# Patient Record
Sex: Female | Born: 1985 | Race: White | Hispanic: No | Marital: Married | Smoking: Never smoker
Health system: Southern US, Community
[De-identification: ages and names within clinical notes are randomized; demographics above are authoritative.]

---

## 2010-03-17 ENCOUNTER — Emergency Department (HOSPITAL_COMMUNITY)
Admission: EM | Admit: 2010-03-17 | Discharge: 2010-03-18 | Disposition: A | Discharge status: Home / Self Care | Attending: Emergency Medicine | Admitting: Emergency Medicine

## 2010-03-17 DIAGNOSIS — R197 Diarrhea, unspecified: Secondary | ICD-10-CM | POA: Insufficient documentation

## 2010-03-17 DIAGNOSIS — N390 Urinary tract infection, site not specified: Secondary | ICD-10-CM | POA: Insufficient documentation

## 2010-03-17 DIAGNOSIS — E860 Dehydration: Secondary | ICD-10-CM | POA: Insufficient documentation

## 2010-03-17 DIAGNOSIS — R112 Nausea with vomiting, unspecified: Secondary | ICD-10-CM | POA: Insufficient documentation

## 2010-03-17 LAB — URINALYSIS, ROUTINE W REFLEX MICROSCOPIC
Nitrite: NEGATIVE
Protein, ur: 30 mg/dL — AB
Urobilinogen, UA: 1 mg/dL (ref 0.0–1.0)
pH: 7 (ref 5.0–8.0)

## 2010-03-17 LAB — CBC
MCH: 31.8 pg (ref 26.0–34.0)
Platelets: 306 10*3/uL (ref 150–400)
RBC: 4.72 MIL/uL (ref 3.87–5.11)
RDW: 12.2 % (ref 11.5–15.5)
WBC: 6.7 10*3/uL (ref 4.0–10.5)

## 2010-03-17 LAB — BASIC METABOLIC PANEL
BUN: 13 mg/dL (ref 6–23)
Chloride: 103 mEq/L (ref 96–112)
Creatinine, Ser: 0.85 mg/dL (ref 0.4–1.2)
GFR calc Af Amer: >60 mL/min (ref >60)
GFR calc non Af Amer: >60 mL/min (ref >60)

## 2010-03-17 LAB — URINE MICROSCOPIC-ADD ON

## 2010-03-17 LAB — PREGNANCY, URINE: Preg Test, Ur: NEGATIVE

## 2010-03-17 LAB — DIFFERENTIAL
Basophils Relative: 0 % (ref 0–1)
Eosinophils Absolute: 0 10*3/uL (ref 0.0–0.7)
Eosinophils Relative: 0 % (ref 0–5)
Neutrophils Relative %: 70 % (ref 43–77)

## 2013-02-06 ENCOUNTER — Emergency Department (HOSPITAL_COMMUNITY): Payer: BC Managed Care – PPO

## 2013-02-06 ENCOUNTER — Emergency Department (HOSPITAL_COMMUNITY)
Admission: EM | Admit: 2013-02-06 | Discharge: 2013-02-07 | Disposition: A | Discharge status: Home / Self Care | Payer: BC Managed Care – PPO | Attending: Emergency Medicine | Admitting: Emergency Medicine

## 2013-02-06 ENCOUNTER — Encounter (HOSPITAL_COMMUNITY): Payer: Self-pay | Admitting: Emergency Medicine

## 2013-02-06 DIAGNOSIS — R5381 Other malaise: Secondary | ICD-10-CM | POA: Insufficient documentation

## 2013-02-06 DIAGNOSIS — R059 Cough, unspecified: Secondary | ICD-10-CM | POA: Insufficient documentation

## 2013-02-06 DIAGNOSIS — R509 Fever, unspecified: Secondary | ICD-10-CM | POA: Insufficient documentation

## 2013-02-06 DIAGNOSIS — J3489 Other specified disorders of nose and nasal sinuses: Secondary | ICD-10-CM | POA: Insufficient documentation

## 2013-02-06 DIAGNOSIS — R1110 Vomiting, unspecified: Secondary | ICD-10-CM | POA: Insufficient documentation

## 2013-02-06 DIAGNOSIS — R6889 Other general symptoms and signs: Secondary | ICD-10-CM

## 2013-02-06 DIAGNOSIS — J029 Acute pharyngitis, unspecified: Secondary | ICD-10-CM | POA: Insufficient documentation

## 2013-02-06 DIAGNOSIS — R05 Cough: Secondary | ICD-10-CM | POA: Insufficient documentation

## 2013-02-06 DIAGNOSIS — R000 Tachycardia, unspecified: Secondary | ICD-10-CM | POA: Insufficient documentation

## 2013-02-06 DIAGNOSIS — R5383 Other fatigue: Secondary | ICD-10-CM

## 2013-02-06 DIAGNOSIS — R51 Headache: Secondary | ICD-10-CM | POA: Insufficient documentation

## 2013-02-06 DIAGNOSIS — Z3202 Encounter for pregnancy test, result negative: Secondary | ICD-10-CM | POA: Insufficient documentation

## 2013-02-06 DIAGNOSIS — Z79899 Other long term (current) drug therapy: Secondary | ICD-10-CM | POA: Insufficient documentation

## 2013-02-06 MED ORDER — SODIUM CHLORIDE 0.9 % IV BOLUS (SEPSIS)
1000.0000 mL | Freq: Once | INTRAVENOUS | Status: AC
  Administered 2013-02-06: 1000 mL via INTRAVENOUS

## 2013-02-06 MED ORDER — IBUPROFEN 800 MG PO TABS
800.0000 mg | ORAL_TABLET | Freq: Once | ORAL | Status: AC
  Administered 2013-02-07: 800 mg via ORAL
  Filled 2013-02-06: qty 1

## 2013-02-06 NOTE — ED Notes (Signed)
Patient has been intermittently sick for @ 2 weeks. Nausea and vomiting, now has a cough that started productive but is now dry and painful.

## 2013-02-06 NOTE — ED Notes (Signed)
Pt states two weekends ago she thought she had a 24 hr virus then last weekend she states she was vomiting again then Saturday she started getting a cough and had sinus drainage  Pt states her cough got worse yesterday and today she started running a fever and having nausea and coughing up foamy white sputum

## 2013-02-07 LAB — URINALYSIS W MICROSCOPIC + REFLEX CULTURE
Bilirubin Urine: NEGATIVE
GLUCOSE, UA: NEGATIVE mg/dL
KETONES UR: NEGATIVE mg/dL
LEUKOCYTES UA: NEGATIVE
NITRITE: NEGATIVE
PROTEIN: NEGATIVE mg/dL
Specific Gravity, Urine: 1.005 (ref 1.005–1.030)
Urobilinogen, UA: 0.2 mg/dL (ref 0.0–1.0)
pH: 6 (ref 5.0–8.0)

## 2013-02-07 LAB — CBC WITH DIFFERENTIAL/PLATELET
BASOS ABS: 0 10*3/uL (ref 0.0–0.1)
Basophils Relative: 0 % (ref 0–1)
EOS ABS: 0 10*3/uL (ref 0.0–0.7)
Eosinophils Relative: 0 % (ref 0–5)
HCT: 37.8 % (ref 36.0–46.0)
Hemoglobin: 13.5 g/dL (ref 12.0–15.0)
LYMPHS PCT: 9 % — AB (ref 12–46)
Lymphs Abs: 1.3 10*3/uL (ref 0.7–4.0)
MCH: 31.3 pg (ref 26.0–34.0)
MCHC: 35.7 g/dL (ref 30.0–36.0)
MCV: 87.7 fL (ref 78.0–100.0)
Monocytes Absolute: 0.6 10*3/uL (ref 0.1–1.0)
Monocytes Relative: 4 % (ref 3–12)
NEUTROS PCT: 87 % — AB (ref 43–77)
Neutro Abs: 12.8 10*3/uL — ABNORMAL HIGH (ref 1.7–7.7)
Platelets: 313 10*3/uL (ref 150–400)
RBC: 4.31 MIL/uL (ref 3.87–5.11)
RDW: 12.4 % (ref 11.5–15.5)
WBC: 14.7 10*3/uL — ABNORMAL HIGH (ref 4.0–10.5)

## 2013-02-07 LAB — POCT I-STAT, CHEM 8
BUN: 7 mg/dL (ref 6–23)
CALCIUM ION: 1.2 mmol/L (ref 1.12–1.23)
CHLORIDE: 102 meq/L (ref 96–112)
Creatinine, Ser: 1 mg/dL (ref 0.50–1.10)
Glucose, Bld: 136 mg/dL — ABNORMAL HIGH (ref 70–99)
HCT: 40 % (ref 36.0–46.0)
Hemoglobin: 13.6 g/dL (ref 12.0–15.0)
Potassium: 4.2 mEq/L (ref 3.7–5.3)
Sodium: 139 mEq/L (ref 137–147)
TCO2: 23 mmol/L (ref 0–100)

## 2013-02-07 LAB — POCT PREGNANCY, URINE: PREG TEST UR: NEGATIVE

## 2013-02-07 MED ORDER — KETOROLAC TROMETHAMINE 30 MG/ML IJ SOLN
30.0000 mg | Freq: Once | INTRAMUSCULAR | Status: AC
  Administered 2013-02-07: 30 mg via INTRAVENOUS

## 2013-02-07 MED ORDER — KETOROLAC TROMETHAMINE 30 MG/ML IJ SOLN
INTRAMUSCULAR | Status: AC
  Filled 2013-02-07: qty 1

## 2013-02-07 MED ORDER — SODIUM CHLORIDE 0.9 % IV BOLUS (SEPSIS)
1000.0000 mL | Freq: Once | INTRAVENOUS | Status: AC
  Administered 2013-02-07: 1000 mL via INTRAVENOUS

## 2013-02-07 MED ORDER — PSEUDOEPHEDRINE HCL 30 MG PO TABS: 30.0000 mg | ORAL_TABLET | ORAL | Status: DC | PRN

## 2013-02-07 MED ORDER — BENZONATATE 100 MG PO CAPS: 100.0000 mg | ORAL_CAPSULE | Freq: Three times a day (TID) | ORAL | Status: DC

## 2013-02-07 NOTE — Discharge Instructions (Signed)
Your lab work and chest x-ray did not show any significant findings. I think you may have the flu. Rest. Drink plenty of fluids. Tylenol and motrin for fever. Tessalon for cough. Sudafed for congestion. Follow up with primary care doctor.   Influenza A (H1N1) H1N1 formerly called "swine flu" is a new influenza virus causing sickness in people. The H1N1 virus is different from seasonal influenza viruses. However, the H1N1 symptoms are similar to seasonal influenza and it is spread from person to person. You may be at higher risk for serious problems if you have underlying serious medical conditions. The CDC and the [employer] are following reported cases around the world. CAUSES   The flu is thought to spread mainly person-to-person through coughing or sneezing of infected people.  A person may become infected by touching something with the virus on it and then touching their mouth or nose. SYMPTOMS   Fever.  Headache.  Tiredness.  Cough.  Sore throat.  Runny or stuffy nose.  Body aches.  Diarrhea and vomiting These symptoms are referred to as "flu-like symptoms." A lot of different illnesses, including the common cold, may have similar symptoms. DIAGNOSIS   There are tests that can tell if you have the H1N1 virus.  Confirmed cases of H1N1 will be reported to the state or local health department.  A doctor's exam may be needed to tell whether you have an infection that is a complication of the flu. HOME CARE INSTRUCTIONS   Stay informed. Visit the [facility] website for current recommendations. Visit [employer]tnwww.cdc.gov/H1N1flu/. You may also call 1-800-CDC-INFO (669-597-33291-(651)535-8827).  Get help early if you develop any of the above symptoms.  If you are at high risk from complications of the flu, talk to your caregiver as soon as you develop flu-like symptoms. Those at higher risk for complications include:  People 65 years or older.  People with chronic medical  conditions.  Pregnant women.  Young children.  Your caregiver may recommend antiviral medicine to help treat the flu.  If you get the flu, get plenty of rest, drink enough water and fluids to keep your urine clear or pale yellow, and avoid using alcohol or tobacco.  You may take over-the-counter medicine to relieve the symptoms of the flu if your caregiver approves. (Never give aspirin to children or teenagers who have flu-like symptoms, particularly fever). TREATMENT  If you do get sick, antiviral drugs are available. These drugs can make your illness milder and make you feel better faster. Treatment should start soon after illness starts. It is only effective if taken within the first day of becoming ill. Only your caregiver can prescribe antiviral medication.  PREVENTION   Cover your nose and mouth with a tissue or your arm when you cough or sneeze. Throw the tissue away.  Wash your hands often with soap and warm water, especially after you cough or sneeze. Alcohol-based cleaners are also effective against germs.  Avoid touching your eyes, nose or mouth. This is one way germs spread.  Try to avoid contact with sick people. Follow public health advice regarding school closures. Avoid crowds.  Stay home if you get sick. Limit contact with others to keep from infecting them. People infected with the H1N1 virus may be able to infect others anywhere from 1 day before feeling sick to 5-7 days after getting flu symptoms.  An H1N1 vaccine is available to help protect against the virus. In addition to the H1N1 vaccine, you will need to be  vaccinated for seasonal influenza. The H1N1 and seasonal vaccines may be given on the same day. The CDC especially recommends the H1N1 vaccine for:  Pregnant women.  People who live with or care for children younger than 7 months of age.  Health care and emergency services personnel.  Persons between the ages of 72 months through 92 years of age.  People  from ages 66 through 63 years who are at higher risk for H1N1 because of chronic health disorders or immune system problems. FACEMASKS In community and home settings, the use of facemasks and N95 respirators are not normally recommended. In certain circumstances, a facemask or N95 respirator may be used for persons at increased risk of severe illness from influenza. Your caregiver can give additional recommendations for facemask use. IN CHILDREN, EMERGENCY WARNING SIGNS THAT NEED URGENT MEDICAL CARE:  Fast breathing or trouble breathing.  Bluish skin color.  Not drinking enough fluids.  Not waking up or not interacting normally.  Being so fussy that the child does not want to be held.  Your child has an oral temperature above 102 F (38.9 C), not controlled by medicine.  Your baby is older than 3 months with a rectal temperature of 102 F (38.9 C) or higher.  Your baby is 54 months old or younger with a rectal temperature of 100.4 F (38 C) or higher.  Flu-like symptoms improve but then return with fever and worse cough. IN ADULTS, EMERGENCY WARNING SIGNS THAT NEED URGENT MEDICAL CARE:  Difficulty breathing or shortness of breath.  Pain or pressure in the chest or abdomen.  Sudden dizziness.  Confusion.  Severe or persistent vomiting.  Bluish color.  You have a oral temperature above 102 F (38.9 C), not controlled by medicine.  Flu-like symptoms improve but return with fever and worse cough. SEEK IMMEDIATE MEDICAL CARE IF:  You or someone you know is experiencing any of the above symptoms. When you arrive at the emergency center, report that you think you have the flu. You may be asked to wear a mask and/or sit in a secluded area to protect others from getting sick. MAKE SURE YOU:   Understand these instructions.  Will watch your condition.  Will get help right away if you are not doing well or get worse. Some of this information courtesy of the CDC.  Document  Released: 07/08/2007 Document Revised: 04/13/2011 Document Reviewed: 07/08/2007 [facility] Patient Information 2014 [place], [place].

## 2013-02-07 NOTE — ED Provider Notes (Signed)
Medical screening examination/treatment/procedure(s) were performed by non-physician practitioner and as supervising physician I was immediately available for consultation/collaboration.  EKG Interpretation   None        Quenisha Lovins K Azalynn Maxim-Rasch, MD 02/07/13 0344 

## 2013-02-07 NOTE — ED Provider Notes (Signed)
Medical screening examination/treatment/procedure(s) were performed by non-physician practitioner and as supervising physician I was immediately available for consultation/collaboration.  EKG Interpretation   None        Clinton Wahlberg K Alailah Safley-Rasch, MD 02/07/13 [phone]

## 2013-02-07 NOTE — ED Notes (Signed)
Patient advised we need a urine sample. Requested to call nurse when she is able to provide sample.

## 2013-02-07 NOTE — ED Provider Notes (Signed)
[facility] care [facility] PA-C at shift change with UA pending. Patient already made aware of plan for d/c should UA be unremarkable.  Urinalysis today does not suggest infection. Patient has remained hemodynamically stable during ED course. Tachycardia has improved since arrival with IVF hydration. Return precautions provided. Patient stable for discharge.   Results for orders placed during the hospital encounter of 02/06/13  CBC WITH DIFFERENTIAL      Result Value Range   WBC 14.7 (*) 4.0 - 10.5 K/uL   RBC 4.31  3.87 - 5.11 MIL/uL   Hemoglobin 13.5  12.0 - 15.0 g/dL   HCT 16.137.8  09.636.0 - 04.546.0 %   MCV 87.7  78.0 - 100.0 fL   MCH 31.3  26.0 - 34.0 pg   MCHC 35.7  30.0 - 36.0 g/dL   RDW 40.912.4  81.111.5 - 91.415.5 %   Platelets 313  150 - 400 K/uL   Neutrophils Relative % 87 (*) 43 - 77 %   Lymphocytes Relative 9 (*) 12 - 46 %   Monocytes Relative 4  3 - 12 %   Eosinophils Relative 0  0 - 5 %   Basophils Relative 0  0 - 1 %   Neutro Abs 12.8 (*) 1.7 - 7.7 K/uL   Lymphs Abs 1.3  0.7 - 4.0 K/uL   Monocytes Absolute 0.6  0.1 - 1.0 K/uL   Eosinophils Absolute 0.0  0.0 - 0.7 K/uL   Basophils Absolute 0.0  0.0 - 0.1 K/uL   WBC Morphology WHITE COUNT CONFIRMED ON SMEAR     Smear Review MORPHOLOGY UNREMARKABLE    URINALYSIS W MICROSCOPIC + REFLEX CULTURE      Result Value Range   Color, Urine YELLOW  YELLOW   APPearance CLEAR  CLEAR   Specific Gravity, Urine 1.005  1.005 - 1.030   pH 6.0  5.0 - 8.0   Glucose, UA NEGATIVE  NEGATIVE mg/dL   Hgb urine dipstick LARGE (*) NEGATIVE   Bilirubin Urine NEGATIVE  NEGATIVE   Ketones, ur NEGATIVE  NEGATIVE mg/dL   Protein, ur NEGATIVE  NEGATIVE mg/dL   Urobilinogen, UA 0.2  0.0 - 1.0 mg/dL   Nitrite NEGATIVE  NEGATIVE   Leukocytes, UA NEGATIVE  NEGATIVE   WBC, UA 0-2  <3 WBC/hpf   RBC / HPF 3-6  <3 RBC/hpf   Bacteria, UA RARE  RARE   Squamous Epithelial / LPF RARE  RARE  POCT I-STAT, CHEM 8      Result Value Range   Sodium 139  137  - 147 mEq/L   Potassium 4.2  3.7 - 5.3 mEq/L   Chloride 102  96 - 112 mEq/L   BUN 7  6 - 23 mg/dL   Creatinine, Ser 7.821.00  0.50 - 1.10 mg/dL   Glucose, Bld 956136 (*) 70 - 99 mg/dL   Calcium, Ion 2.131.20  1.12 - 1.23 mmol/L   TCO2 23  0 - 100 mmol/L   Hemoglobin 13.6  12.0 - 15.0 g/dL   HCT 08.640.0  57.836.0 - 46.946.0 %  POCT PREGNANCY, URINE      Result Value Range   Preg Test, Ur NEGATIVE  NEGATIVE   Dg Chest 2 View  02/07/2013   CLINICAL DATA:  Cough and fever  EXAM: CHEST  2 VIEW  COMPARISON:  None.  FINDINGS: Lungs clear. Heart size and pulmonary vascularity are normal. No adenopathy. No bone lesions.  IMPRESSION: No abnormality noted.   Electronically Signed  By: Bretta Bang M.D.   On: 02/07/2013 00:17      Antony Madura, PA-C 02/07/13 0335

## 2013-02-07 NOTE — ED Provider Notes (Signed)
CSN: [id number]     Arrival date & time 02/06/13  2003 History   First MD Initiated Contact with Patient 02/06/13 2304     Chief Complaint  Patient presents with  . Cough  . Fever  . Emesis   (Consider location/radiation/quality/duration/timing/severity/associated sxs/prior Treatment) HPI Dawn Bennett is a 28 y.o. female who presents to emergency department complaining of congestion, sore throat, cough, fever. States she had fever up to 103 today. She took Tylenol earlier today and Robitussin this afternoon. States her symptoms began 2 days ago. She denies any nausea or vomiting however states she had several episodes of nausea and vomiting 2 weeks ago. She denies any recent travel. She denies any sick contacts. She denies any chest pain or shortness of breath. There is no abdominal pain. There is no urinary symptoms. She denies being pregnant. Nothing is making her symptoms better or worse. No neck pain or stiffness.     History reviewed. No pertinent past medical history. History reviewed. No pertinent past surgical history. Family History  Problem Relation Age of Onset  . Hypertension Father   . Diabetes Other   . Cancer Other    History  Substance Use Topics  . Smoking status: Never Smoker   . Smokeless tobacco: Not on file  . Alcohol Use: Yes     Comment: rare   OB History   Grav Para Term Preterm Abortions TAB SAB Ect Mult Living                 Review of Systems  Constitutional: Positive for fever, chills and fatigue.  HENT: Positive for congestion, rhinorrhea, sinus pressure and sore throat. Negative for trouble swallowing and voice change.   Respiratory: Positive for cough. Negative for chest tightness and shortness of breath.   Cardiovascular: Negative for chest pain, palpitations and leg swelling.  Gastrointestinal: Negative for nausea, vomiting, abdominal pain and diarrhea.  Genitourinary: Negative for dysuria, flank pain, vaginal bleeding, vaginal  discharge, vaginal pain and pelvic pain.  Musculoskeletal: Negative for arthralgias, myalgias, neck pain and neck stiffness.  Skin: Negative for rash.  Neurological: Positive for weakness and headaches. Negative for dizziness.  All other systems reviewed and are negative.    Allergies  Review of patient's allergies indicates no known allergies.  Home Medications   Current Outpatient Rx  Name  Route  Sig  Dispense  Refill  . dextromethorphan (DELSYM) 30 MG/5ML liquid   Oral   Take 30 mg by mouth as needed for cough (cough).         Marland Kitchen guaiFENesin (ROBITUSSIN) 100 MG/5ML liquid   Oral   Take 200 mg by mouth 3 (three) times daily as needed for cough (cold).         Marland Kitchen ibuprofen (ADVIL,MOTRIN) 200 MG tablet   Oral   Take 200 mg by mouth every 6 (six) hours as needed (cold).         . Phenylephrine-DM-GG-APAP (TYLENOL COLD/FLU SEVERE PO)   Oral   Take 30 mLs by mouth every 8 (eight) hours as needed (cold).          BP 100/82  Pulse 146  Temp(Src) 102.8 F (39.3 C) (Oral)  Resp 20  Ht 5\' 3"  (1.6 m)  Wt 240 lb (108.863 kg)  BMI 42.52 kg/m2  SpO2 94%  LMP 02/08/2012 Physical Exam  Nursing note and vitals reviewed. Constitutional: She is oriented to person, place, and time. She appears well-developed and well-nourished. No distress.  HENT:  Head: Normocephalic.  Right Ear: Tympanic membrane, external ear and ear canal normal.  Left Ear: Tympanic membrane, external ear and ear canal normal.  Nose: Mucosal edema and rhinorrhea present.  Mouth/Throat: Oropharynx is clear and moist. No oropharyngeal exudate or posterior oropharyngeal edema.  Eyes: Conjunctivae are normal.  Neck: Neck supple.  Cardiovascular: Regular rhythm and normal heart sounds.   tachycardic  Pulmonary/Chest: Effort normal and breath sounds normal. No respiratory distress. She has no wheezes. She has no rales.  Abdominal: Soft. Bowel sounds are normal. She exhibits no distension. There is no  tenderness. There is no rebound.  Musculoskeletal: She exhibits no edema.  Neurological: She is alert and oriented to person, place, and time.  Skin: Skin is warm and dry.  Psychiatric: She has a normal mood and affect. Her behavior is normal.    ED Course  Procedures (including critical care time) Labs Review Labs Reviewed  CBC WITH DIFFERENTIAL - Abnormal; Notable for the following:    WBC 14.7 (*)    Neutrophils Relative % 87 (*)    Lymphocytes Relative 9 (*)    Neutro Abs 12.8 (*)    All other components within normal limits  POCT I-STAT, CHEM 8 - Abnormal; Notable for the following:    Glucose, Bld 136 (*)    All other components within normal limits  URINALYSIS W MICROSCOPIC + REFLEX CULTURE   Imaging Review Dg Chest 2 View  02/07/2013   CLINICAL DATA:  Cough and fever  EXAM: CHEST  2 VIEW  COMPARISON:  None.  FINDINGS: Lungs clear. Heart size and pulmonary vascularity are normal. No adenopathy. No bone lesions.  IMPRESSION: No abnormality noted.   Electronically Signed   By: Bretta BangWilliam  Woodruff M.D.   On: 02/07/2013 00:17    EKG Interpretation   None       MDM   1. Flu-like symptoms     Patient flulike illness. Initial heart rate is 146, temperature 102.8. Blood pressure is normal. Labs and x-ray obtained. Patient started on IV fluids, Tylenol given. Will monitor.   2:22 AM Patient reassessed. UA pending. Patient is feeling much better. Heart rate is now 109 and the monitor. We'll continue to hydrate while waiting for urinalysis. Patient to be going home with Tessalon for cough, Sudafed for congestion, Tylenol Motrin for fever. Rest. Instructed to drink plenty of fluids and followup with primary care Dr. Gean QuintNontender fluid dictated for her at this time. She has no meningeal signs no signs of any other major infections.   Filed Vitals:   02/06/13 2045 02/07/13 0134  BP: 100/82 142/86  Pulse: 146 116  Temp: 102.8 F (39.3 C)   TempSrc: Oral   Resp: 20 18  Height:  5\' 3"  (1.6 m)   Weight: 240 lb (108.863 kg)   SpO2: 94% 97%      Lottie Musselatyana A Crystale Giannattasio, PA-C 02/07/13 [id number]

## 2013-06-21 ENCOUNTER — Encounter (HOSPITAL_COMMUNITY): Payer: Self-pay | Admitting: Emergency Medicine

## 2013-06-21 ENCOUNTER — Emergency Department (HOSPITAL_COMMUNITY)
Admission: EM | Admit: 2013-06-21 | Discharge: 2013-06-22 | Disposition: A | Discharge status: Home / Self Care | Payer: BC Managed Care – PPO | Attending: Emergency Medicine | Admitting: Emergency Medicine

## 2013-06-21 ENCOUNTER — Emergency Department (HOSPITAL_COMMUNITY): Payer: BC Managed Care – PPO

## 2013-06-21 DIAGNOSIS — Z23 Encounter for immunization: Secondary | ICD-10-CM | POA: Insufficient documentation

## 2013-06-21 DIAGNOSIS — S61451A Open bite of right hand, initial encounter: Secondary | ICD-10-CM

## 2013-06-21 DIAGNOSIS — L089 Local infection of the skin and subcutaneous tissue, unspecified: Secondary | ICD-10-CM

## 2013-06-21 DIAGNOSIS — Y9389 Activity, other specified: Secondary | ICD-10-CM | POA: Insufficient documentation

## 2013-06-21 DIAGNOSIS — Y929 Unspecified place or not applicable: Secondary | ICD-10-CM | POA: Insufficient documentation

## 2013-06-21 DIAGNOSIS — S61409A Unspecified open wound of unspecified hand, initial encounter: Secondary | ICD-10-CM | POA: Insufficient documentation

## 2013-06-21 DIAGNOSIS — IMO0001 Reserved for inherently not codable concepts without codable children: Secondary | ICD-10-CM | POA: Insufficient documentation

## 2013-06-21 DIAGNOSIS — W5501XA Bitten by cat, initial encounter: Secondary | ICD-10-CM

## 2013-06-21 MED ORDER — AMPICILLIN-SULBACTAM SODIUM 1.5 (1-0.5) G IJ SOLR
1.5000 g | Freq: Once | INTRAMUSCULAR | Status: AC
  Administered 2013-06-22: 1.5 g via INTRAVENOUS
  Filled 2013-06-21: qty 1.5

## 2013-06-21 MED ORDER — TETANUS-DIPHTH-ACELL PERTUSSIS 5-2.5-18.5 LF-MCG/0.5 IM SUSP
0.5000 mL | Freq: Once | INTRAMUSCULAR | Status: AC
  Administered 2013-06-22: 0.5 mL via INTRAMUSCULAR
  Filled 2013-06-21: qty 0.5

## 2013-06-21 NOTE — ED Provider Notes (Signed)
CSN: [id number]     Arrival date & time 06/21/13  2224 History   First MD Initiated Contact with Patient 06/21/13 2315     Chief Complaint  Patient presents with  . Animal Bite     (Consider location/radiation/quality/duration/timing/severity/associated sxs/prior Treatment) HPI Comments: Patient is a 28 year old female presented to the emergency department for a cat Bite to her right hand. Patient states that the animal bite occurred this morning around 7 PM. She states since then she had increased swelling and redness to the dorsum of her hand. She states it is only painful with palpation. Her pain is alleviated with Motrin. Patient states it was her cat that bit her, vaccinations are all up to date for the cat. Patient is unsure of her tetanus status. Denies any numbness or tingling. No previous injury to the right hand. Patient is right-hand dominant. Denies any fevers or chills.  Patient is a 28 y.o. female presenting with animal bite.  Animal Bite Associated symptoms: no fever     History reviewed. No pertinent past medical history. History reviewed. No pertinent past surgical history. Family History  Problem Relation Age of Onset  . Hypertension Father   . Diabetes Other   . Cancer Other    History  Substance Use Topics  . Smoking status: Never Smoker   . Smokeless tobacco: Not on file  . Alcohol Use: Yes     Comment: rare   OB History   Grav Para Term Preterm Abortions TAB SAB Ect Mult Living                 Review of Systems  Constitutional: Negative for fever and chills.  Skin: Positive for wound.  All other systems reviewed and are negative.     Allergies  Bee venom  Home Medications   Prior to Admission medications   Medication Sig Start Date End Date Taking? Authorizing Provider  acetaminophen (TYLENOL) 500 MG tablet Take 500 mg by mouth every 6 (six) hours as needed (for pain/swelling).   Yes Historical Provider, MD  ibuprofen (ADVIL,MOTRIN) 200 MG  tablet Take 600 mg by mouth every 6 (six) hours as needed (for swelling/pain).    Yes Historical Provider, MD   BP 132/86  Pulse 102  Temp(Src) 98.4 F (36.9 C) (Oral)  Resp 18  SpO2 98%  LMP 03/04/2013 Physical Exam  Nursing note and vitals reviewed. Constitutional: She is oriented to person, place, and time. She appears well-developed and well-nourished. No distress.  HENT:  Head: Normocephalic and atraumatic.  Right Ear: External ear normal.  Left Ear: External ear normal.  Nose: Nose normal.  Mouth/Throat: Oropharynx is clear and moist.  Eyes: Conjunctivae are normal.  Neck: Normal range of motion. Neck supple.  Cardiovascular: Normal rate, regular rhythm, normal heart sounds and intact distal pulses.   Pulmonary/Chest: Effort normal.  Abdominal: Soft.  Musculoskeletal: Normal range of motion.       Right wrist: Normal.       Left wrist: Normal.       Right hand: She exhibits tenderness and swelling. She exhibits normal range of motion and normal capillary refill. Normal sensation noted. Normal strength noted.       Left hand: Normal.       Hands: Neurological: She is alert and oriented to person, place, and time.  Skin: Skin is warm and dry. She is not diaphoretic.  Psychiatric: She has a normal mood and affect.    ED Course  Procedures (including critical  care time) Medications  Tdap (BOOSTRIX) injection 0.5 mL (0.5 mLs Intramuscular Given 06/22/13 0004)  ampicillin-sulbactam (UNASYN) 1.5 g in sodium chloride 0.9 % 50 mL IVPB (1.5 g Intravenous New Bag/Given 06/22/13 0007)    Labs Review Labs Reviewed - No data to display  Imaging Review Dg Hand Complete Right  06/22/2013   CLINICAL DATA:  ANIMAL BITE  EXAM: RIGHT HAND - COMPLETE 3+ VIEW  COMPARISON:  None.  FINDINGS: There is no evidence of fracture or dislocation. There is no evidence of arthropathy or other focal bone abnormality. Soft tissues are unremarkable.  IMPRESSION: Negative.   Electronically Signed   By:  Salome HolmesHector  Cooper M.D.   On: 06/22/2013 00:21     EKG Interpretation None      MDM   Final diagnoses:  Cat bite of right hand with infection    Filed Vitals:   06/21/13 2257  BP: 132/86  Pulse: 102  Temp: 98.4 F (36.9 C)  Resp: 18   Afebrile, NAD, non-toxic appearing, AAOx4.   Patient presented to emergency department for cat bite that occurred this morning at 7AM. Erythematous, swollen, warm, nontender to palpation area on dorsum of right hand between first and second digit noted, there is involvement of first digit. Area of erythema has been marked with a surgical pen and time stamped during initial evaluation of patient. Range of motion is intact. Sensation is intact. X-ray negative for acute findings. Tetanus up to date. Will not require any previous prophylaxis as cat is up-to-date on its vaccinations. Dose of IV unasyn given in ED. of discharge patient home with Augmentin prescription and advised hand surgeon follow up in the morning, the patient is unable to follow up with hand surgeon tomorrow she should come back to the emergency department for reevaluation for possible further testing and admission at that time. Patient is agreeable to plan. Patient d/w with Dr. Norlene Campbelltter, agrees with plan.    Jeannetta EllisJennifer L Cyanne Delmar, PA-C 06/22/13 [phone]

## 2013-06-21 NOTE — ED Notes (Signed)
Patient transported to X-ray 

## 2013-06-21 NOTE — ED Notes (Signed)
Per pt report: pt was bitten by pt's cat on pt's right hand.  Pt reports her own cat's shots are up to date.  Pt's hand is red and swollen.  Pt ambulatory. NAD noted.

## 2013-06-22 MED ORDER — AMOXICILLIN-POT CLAVULANATE 875-125 MG PO TABS: 1.0000 | ORAL_TABLET | Freq: Two times a day (BID) | ORAL | Status: AC

## 2013-06-22 NOTE — Discharge Instructions (Signed)
Please follow up with Dr. Melvyn Novasrtmann tomorrow if he is unable to see you in his office tomorrow please return to the emergency department for reevaluation. If the redness in her hands it passes the border drawn prior to being reevaluated please return to the emergency department immediately. Please take antibiotics as prescribed. Please alternate between Motrin and Tylenol every three hours for pain. Please read all discharge instructions and return precautions.    Cat Scratch Disease Cats often injure people by scratching or biting. This site of injury can become infected with a particular germ or bacteria present in the mouth of or on the cat. This germ is called Bartonella henselae. This infection is identified by the common name cat scratch disease (CSD).  SYMPTOMS  A red and sore pimple or bump, with or without pus, on the skin where the cat scratched or bit. The pimple or sore may be present for as long as three weeks after the scratch or bite occurred.  One or more enlarged lymph glands located toward the center of the body from where the injury occurred.  Less common symptoms include low-grade fever, tiredness, fatigue, headache and/or sore throat. DIAGNOSIS  The diagnosis is typically made by your caregiver who notes the history of a scratch or bite from a cat, and finds the skin sore and swollen lymph glands in the described area.  Culture of any drainage or pus from the injury site, or a needle aspiration or piece of tissue (biopsy) from a swollen lymph gland may also be done to confirm the diagnosis and assure that a different infection or disease is not causing your illness. Rare but serious complications may occur, they include:  Parinaud's syndrome - fever, swollen lymph glands and inflammation of the eye (conjunctivitis).  Infection of the brain (encephalitis).  Infection of the nerve of the eye (neuroretinitis).  Infection of the bone (osteomyelitis). TREATMENT  Usually  treatment is not necessary or helpful, especially if you have a normal immune system. When infection is very severe, it may be treated with a medicine that kills the bacteria (antibiotic).  People with immune system problems (such as having AIDS or an organ transplant, or being on steroids or other immune modifying drugs) should be treated with antibiotics. HOME CARE INSTRUCTIONS   Avoid injury while playing with cats.  Wash well after playing with cats.  Do not let your cat lick sores on your body.  Do not let your cat roam around outside of your house.  Keep the area of the cat scratch clean. Wash it with soap and water or apply an antiseptic solution such as povidone iodine.  You should get a tetanus shot if you have not had one in the past 5 or 10 years. If you receive one, your arm may get swollen and red and warm to the touch at the shot site. This is a common response to the medication in the shot. If you did not receive a tetanus shot here because you did not recall when your last one was given, make sure to check with your caregiver's office and determine if one is needed. Generally, for a "dirty" wound, you should receive a tetanus booster if you have not had one in the last five years. If you have a "clean" wound, you should receive a tetanus booster if you have not had one in the last ten years. SEEK IMMEDIATE MEDICAL CARE IF:   You have worsening signs of infection, such as more redness, increased  pain, red streaking or pus coming from the wound, or warmth or swelling around the area of the scratch.  You develop worsening swollen lymph glands.  You develop abdominal pain, have problems with your vision or develop a skin rash.  You have a fever.  You become more tired or dizzy, or have a worsening headache.  You develop inflammation of your eye or have increasing vision problems.  You have pain in one of your bones.  You develop a stiff neck.  You pass out. MAKE SURE  YOU:   Understand these instructions.  Will watch your condition.  Will get help right away if you are not doing well or get worse. Document Released: 01/17/2000 Document Revised: 04/13/2011 Document Reviewed: 02/29/2008 [facility] Patient Information 2014 [place], [place].

## 2013-06-22 NOTE — ED Provider Notes (Signed)
Medical screening examination/treatment/procedure(s) were performed by non-physician practitioner and as supervising physician I was immediately available for consultation/collaboration.   EKG Interpretation None       Cornelio Parkerson M Ronia Hazelett, MD 06/22/13 0548 

## 2013-06-23 ENCOUNTER — Emergency Department (HOSPITAL_COMMUNITY)
Admission: EM | Admit: 2013-06-23 | Discharge: 2013-06-23 | Disposition: A | Discharge status: Home / Self Care | Payer: BC Managed Care – PPO | Attending: Emergency Medicine | Admitting: Emergency Medicine

## 2013-06-23 ENCOUNTER — Encounter (HOSPITAL_COMMUNITY): Payer: Self-pay | Admitting: Emergency Medicine

## 2013-06-23 DIAGNOSIS — IMO0001 Reserved for inherently not codable concepts without codable children: Secondary | ICD-10-CM | POA: Insufficient documentation

## 2013-06-23 DIAGNOSIS — S61451A Open bite of right hand, initial encounter: Secondary | ICD-10-CM

## 2013-06-23 DIAGNOSIS — S61409A Unspecified open wound of unspecified hand, initial encounter: Secondary | ICD-10-CM | POA: Insufficient documentation

## 2013-06-23 DIAGNOSIS — L089 Local infection of the skin and subcutaneous tissue, unspecified: Secondary | ICD-10-CM

## 2013-06-23 DIAGNOSIS — Z792 Long term (current) use of antibiotics: Secondary | ICD-10-CM | POA: Insufficient documentation

## 2013-06-23 DIAGNOSIS — W5501XA Bitten by cat, initial encounter: Secondary | ICD-10-CM

## 2013-06-23 DIAGNOSIS — Y929 Unspecified place or not applicable: Secondary | ICD-10-CM | POA: Insufficient documentation

## 2013-06-23 DIAGNOSIS — Y939 Activity, unspecified: Secondary | ICD-10-CM | POA: Insufficient documentation

## 2013-06-23 LAB — CBC WITH DIFFERENTIAL/PLATELET
BASOS ABS: 0 10*3/uL (ref 0.0–0.1)
Basophils Relative: 0 % (ref 0–1)
Eosinophils Absolute: 0.1 10*3/uL (ref 0.0–0.7)
Eosinophils Relative: 1 % (ref 0–5)
HCT: 39 % (ref 36.0–46.0)
Hemoglobin: 13.7 g/dL (ref 12.0–15.0)
LYMPHS ABS: 2.1 10*3/uL (ref 0.7–4.0)
LYMPHS PCT: 23 % (ref 12–46)
MCH: 30.8 pg (ref 26.0–34.0)
MCHC: 35.1 g/dL (ref 30.0–36.0)
MCV: 87.6 fL (ref 78.0–100.0)
Monocytes Absolute: 0.6 10*3/uL (ref 0.1–1.0)
Monocytes Relative: 6 % (ref 3–12)
NEUTROS ABS: 6.5 10*3/uL (ref 1.7–7.7)
NEUTROS PCT: 70 % (ref 43–77)
PLATELETS: 324 10*3/uL (ref 150–400)
RBC: 4.45 MIL/uL (ref 3.87–5.11)
RDW: 12.4 % (ref 11.5–15.5)
WBC: 9.3 10*3/uL (ref 4.0–10.5)

## 2013-06-23 LAB — BASIC METABOLIC PANEL
BUN: 12 mg/dL (ref 6–23)
CHLORIDE: 100 meq/L (ref 96–112)
CO2: 25 meq/L (ref 19–32)
Calcium: 9.7 mg/dL (ref 8.4–10.5)
Creatinine, Ser: 0.76 mg/dL (ref 0.50–1.10)
GFR calc Af Amer: >90 mL/min (ref >90)
GFR calc non Af Amer: >90 mL/min (ref >90)
GLUCOSE: 91 mg/dL (ref 70–99)
POTASSIUM: 4.3 meq/L (ref 3.7–5.3)
SODIUM: 139 meq/L (ref 137–147)

## 2013-06-23 MED ORDER — SODIUM CHLORIDE 0.9 % IV BOLUS (SEPSIS)
1000.0000 mL | Freq: Once | INTRAVENOUS | Status: AC
  Administered 2013-06-23: 1000 mL via INTRAVENOUS

## 2013-06-23 MED ORDER — HYDROMORPHONE HCL PF 1 MG/ML IJ SOLN
0.5000 mg | Freq: Once | INTRAMUSCULAR | Status: AC
  Administered 2013-06-23: 0.5 mg via INTRAVENOUS
  Filled 2013-06-23: qty 1

## 2013-06-23 MED ORDER — SODIUM CHLORIDE 0.9 % IV SOLN
3.0000 g | Freq: Once | INTRAVENOUS | Status: AC
  Administered 2013-06-23: 3 g via INTRAVENOUS
  Filled 2013-06-23: qty 3

## 2013-06-23 NOTE — Discharge Instructions (Signed)

## 2013-06-23 NOTE — ED Provider Notes (Signed)
CSN: [id number]     Arrival date & time 06/23/13  0321 History   First MD Initiated Contact with Patient 06/23/13 [phone]     Chief Complaint  Patient presents with  . Hand Injury     (Consider location/radiation/quality/duration/timing/severity/associated sxs/prior Treatment) HPI 28 year old female presents with worsening pain in her right hand since a cat bite about 48 hours ago. This is her cat that bit her. She seen in the ER about 24 hours ago and given a dose of IV antibiotics and then she's taken one dose of her oral antibiotics. She followed up with the hand surgeon, Dr. Melvyn Novas, who evaluated her and she was then sent home. She feels like the swelling and pain worsened overnight tonight and the redness has spread past the mark that was put which is in the ER. Denies a fevers or chills. No drainage. She feels like her ring finger is getting numb on the radial aspect.  History reviewed. No pertinent past medical history. History reviewed. No pertinent past surgical history. Family History  Problem Relation Age of Onset  . Hypertension Father   . Diabetes Other   . Cancer Other    History  Substance Use Topics  . Smoking status: Never Smoker   . Smokeless tobacco: Never Used  . Alcohol Use: Yes     Comment: rare   OB History   Grav Para Term Preterm Abortions TAB SAB Ect Mult Living                 Review of Systems  Constitutional: Negative for fever.  Musculoskeletal: Positive for joint swelling.  Skin: Positive for color change and wound.  Neurological: Positive for numbness. Negative for weakness.  All other systems reviewed and are negative.     Allergies  Bee venom  Home Medications   Prior to Admission medications   Medication Sig Start Date End Date Taking? Authorizing Provider  acetaminophen (TYLENOL) 500 MG tablet Take 500 mg by mouth every 6 (six) hours as needed (for pain/swelling).   Yes Historical Provider, MD  amoxicillin-clavulanate (AUGMENTIN)  875-125 MG per tablet Take 1 tablet by mouth every 12 (twelve) hours. 06/22/13  Yes Jennifer L Piepenbrink, PA-C  ibuprofen (ADVIL,MOTRIN) 200 MG tablet Take 600 mg by mouth every 6 (six) hours as needed (for swelling/pain).    Yes Historical Provider, MD   BP 142/90  Pulse 99  Temp(Src) 98.3 F (36.8 C) (Oral)  Resp 16  Ht 5\' 4"  (1.626 m)  Wt 232 lb (105.235 kg)  BMI 39.80 kg/m2  SpO2 99%  LMP 03/04/2013 Physical Exam  Nursing note and vitals reviewed. Constitutional: She is oriented to person, place, and time. She appears well-developed and well-nourished. No distress.  HENT:  Head: Normocephalic and atraumatic.  Right Ear: External ear normal.  Left Ear: External ear normal.  Nose: Nose normal.  Eyes: Right eye exhibits no discharge. Left eye exhibits no discharge.  Cardiovascular: Normal rate, regular rhythm and normal heart sounds.   Pulmonary/Chest: Effort normal and breath sounds normal.  Abdominal: She exhibits no distension.  Musculoskeletal:       Right hand: She exhibits tenderness and swelling.       Hands: Decreased ROM of right hand to closing fist, mostly due to amount of swelling and pain  Neurological: She is alert and oriented to person, place, and time.  Skin: Skin is warm and dry. There is erythema.    ED Course  Procedures (including critical care time) Labs Review  Labs Reviewed  CBC WITH DIFFERENTIAL  BASIC METABOLIC PANEL    Imaging Review Dg Hand Complete Right  06/22/2013   CLINICAL DATA:  ANIMAL BITE  EXAM: RIGHT HAND - COMPLETE 3+ VIEW  COMPARISON:  None.  FINDINGS: There is no evidence of fracture or dislocation. There is no evidence of arthropathy or other focal bone abnormality. Soft tissues are unremarkable.  IMPRESSION: Negative.   Electronically Signed   By: Salome HolmesHector  Cooper M.D.   On: 06/22/2013 00:21     EKG Interpretation None      MDM   Final diagnoses:  Cat bite of right hand with infection    Patient's hand appears to be  getting slowly worse. D/w Dr. Melvyn Novasrtmann, who asks for her to finish IV unasyn in ED, get hand re-splinted, and follow up in his clinic this afternoon and he will re-evaluate here. No signs of systemic disease. Patient agreeable to plan.    Audree CamelScott T Evaleigh Mccamy, MD 06/23/13 [phone]

## 2013-06-23 NOTE — ED Notes (Addendum)
Pt reports that her R arm was scratched on Wednesday morning by a cat, seen yesterday by the hand surgeon and told to come to the ED if pain redness or swelling increased. Pt states her hand is now painful, and it was not before, has increased in swelling and redness. Pt has hand and wrist wrapped at this time. Able to move all fingers, cap refill less than 3 seconds. Reports she took Tylenol last at 0200.

## 2015-10-28 IMAGING — CR DG HAND COMPLETE 3+V*R*
3 series · 3 of 3 positions shown · non-contrast
Comparison: None.

CLINICAL DATA: ANIMAL BITE

EXAM:
RIGHT HAND - COMPLETE 3+ VIEW

[x hand pa right]
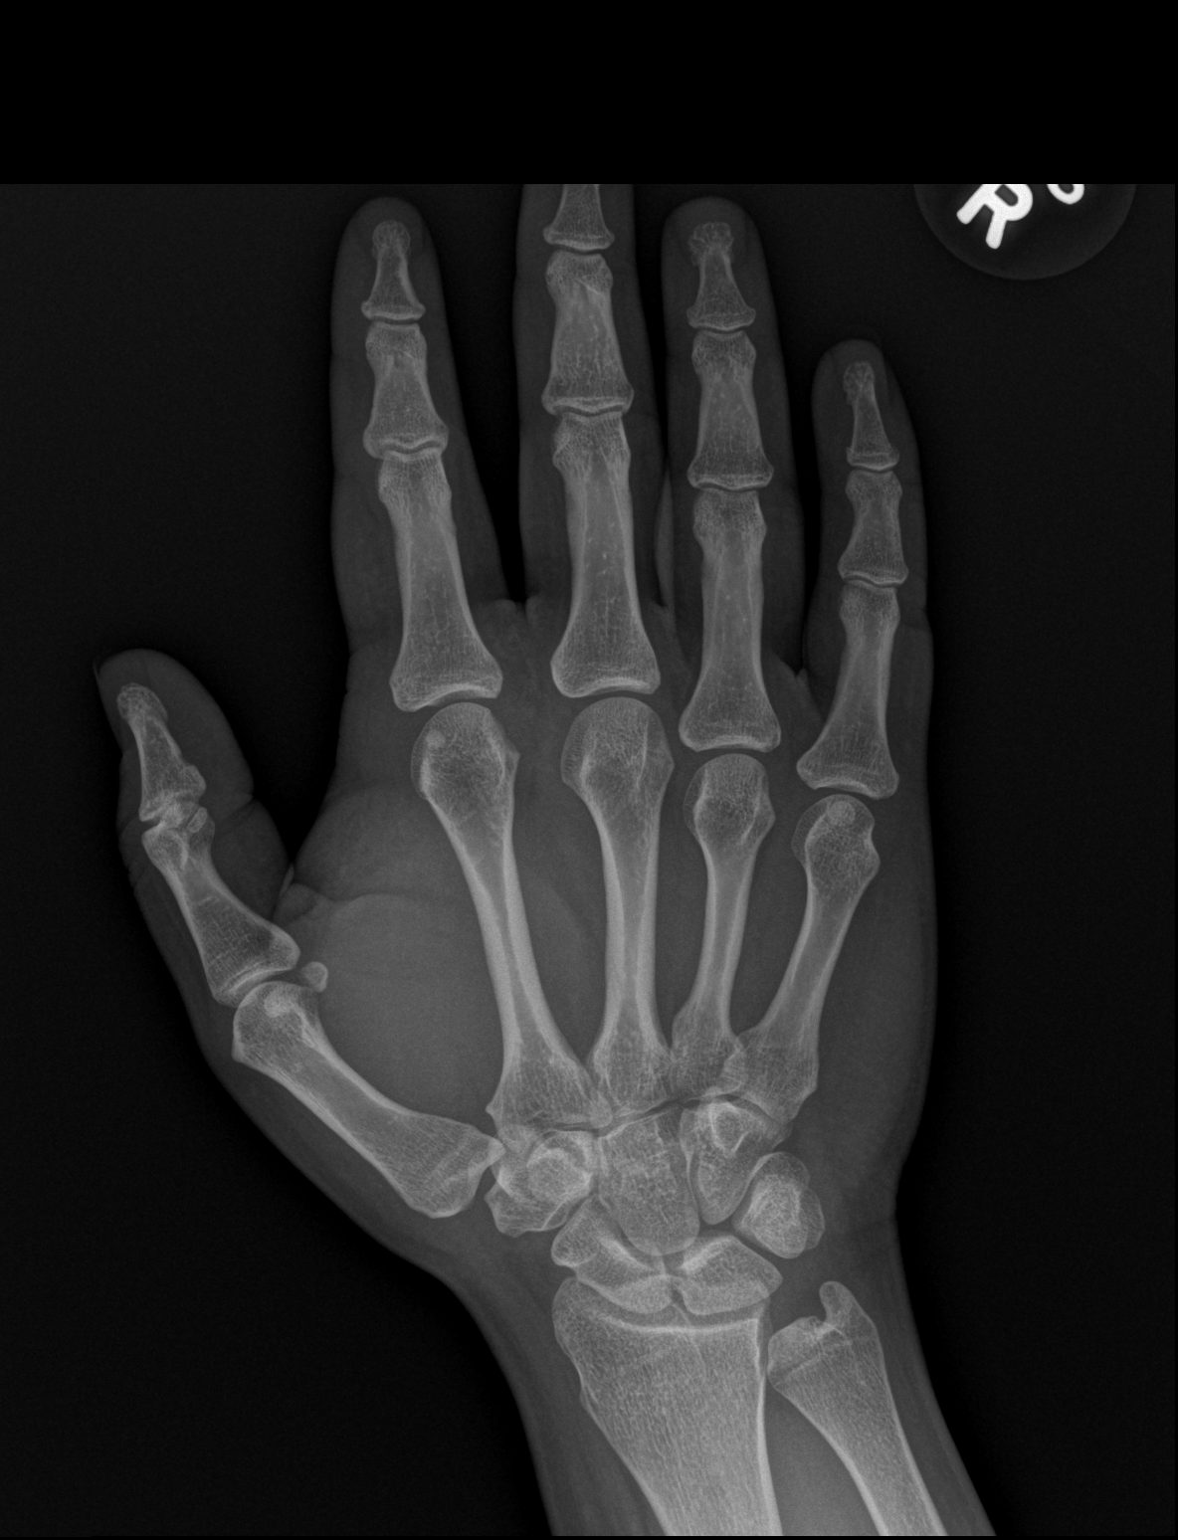

[x hand obl right]
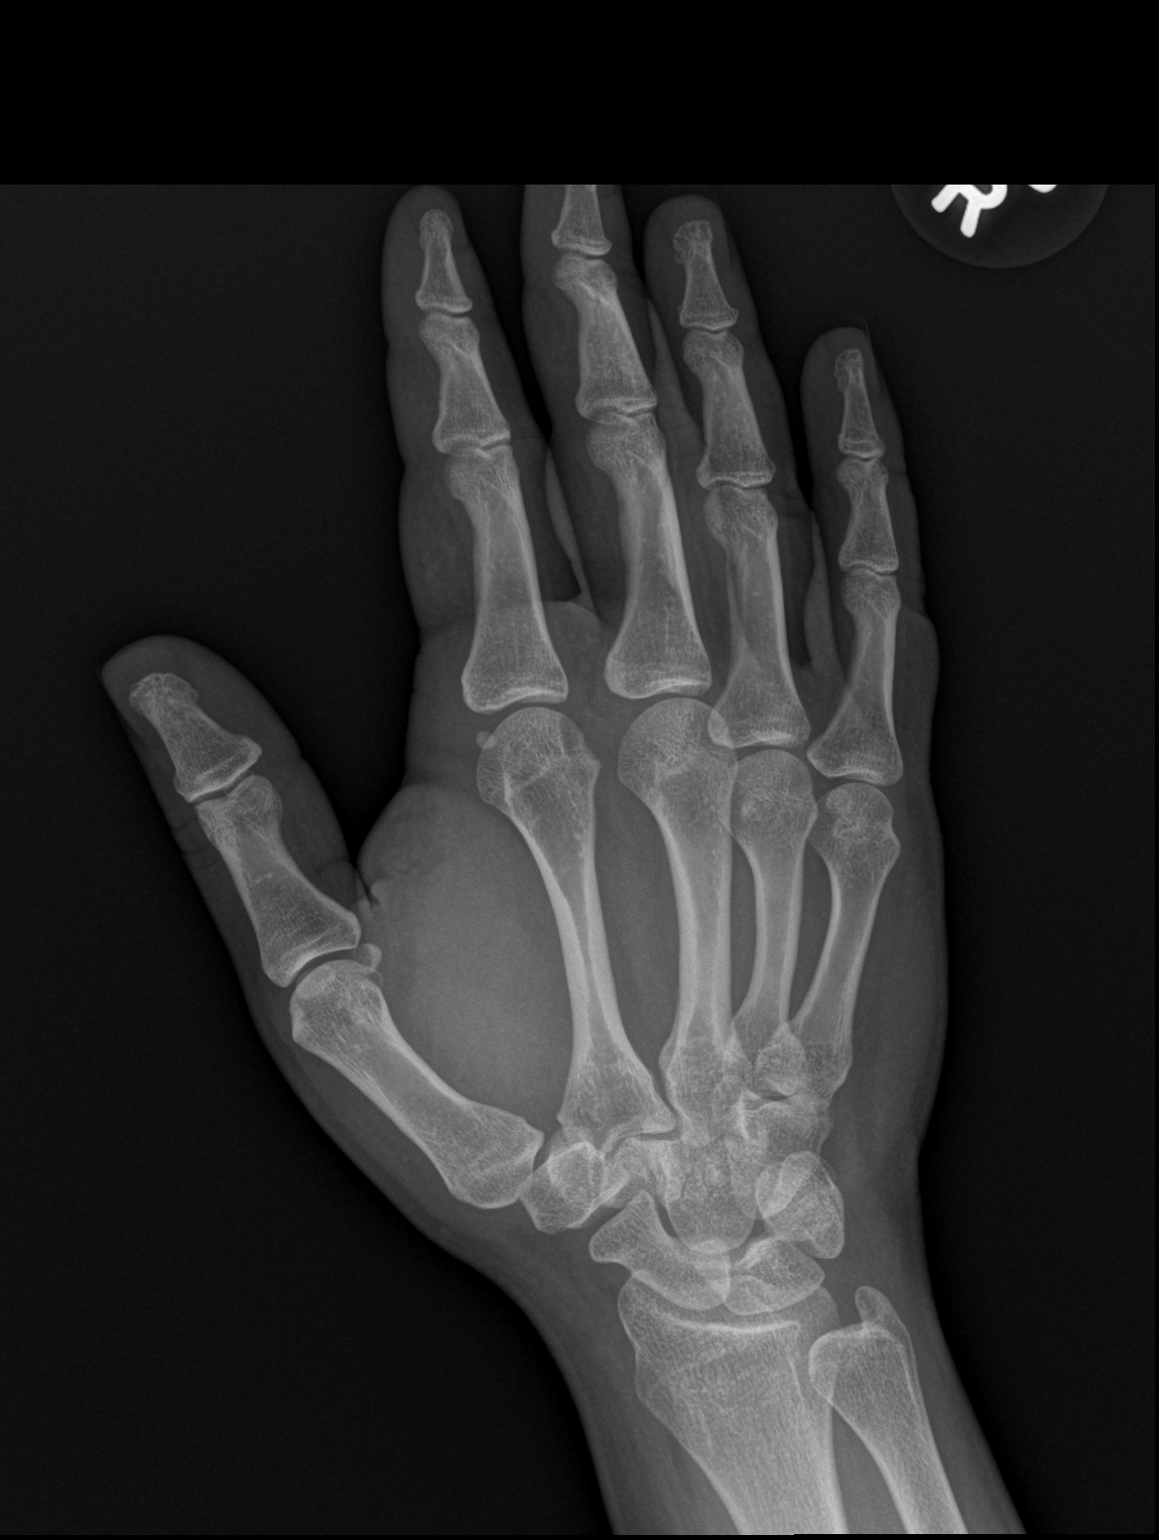

[x hand lat right]
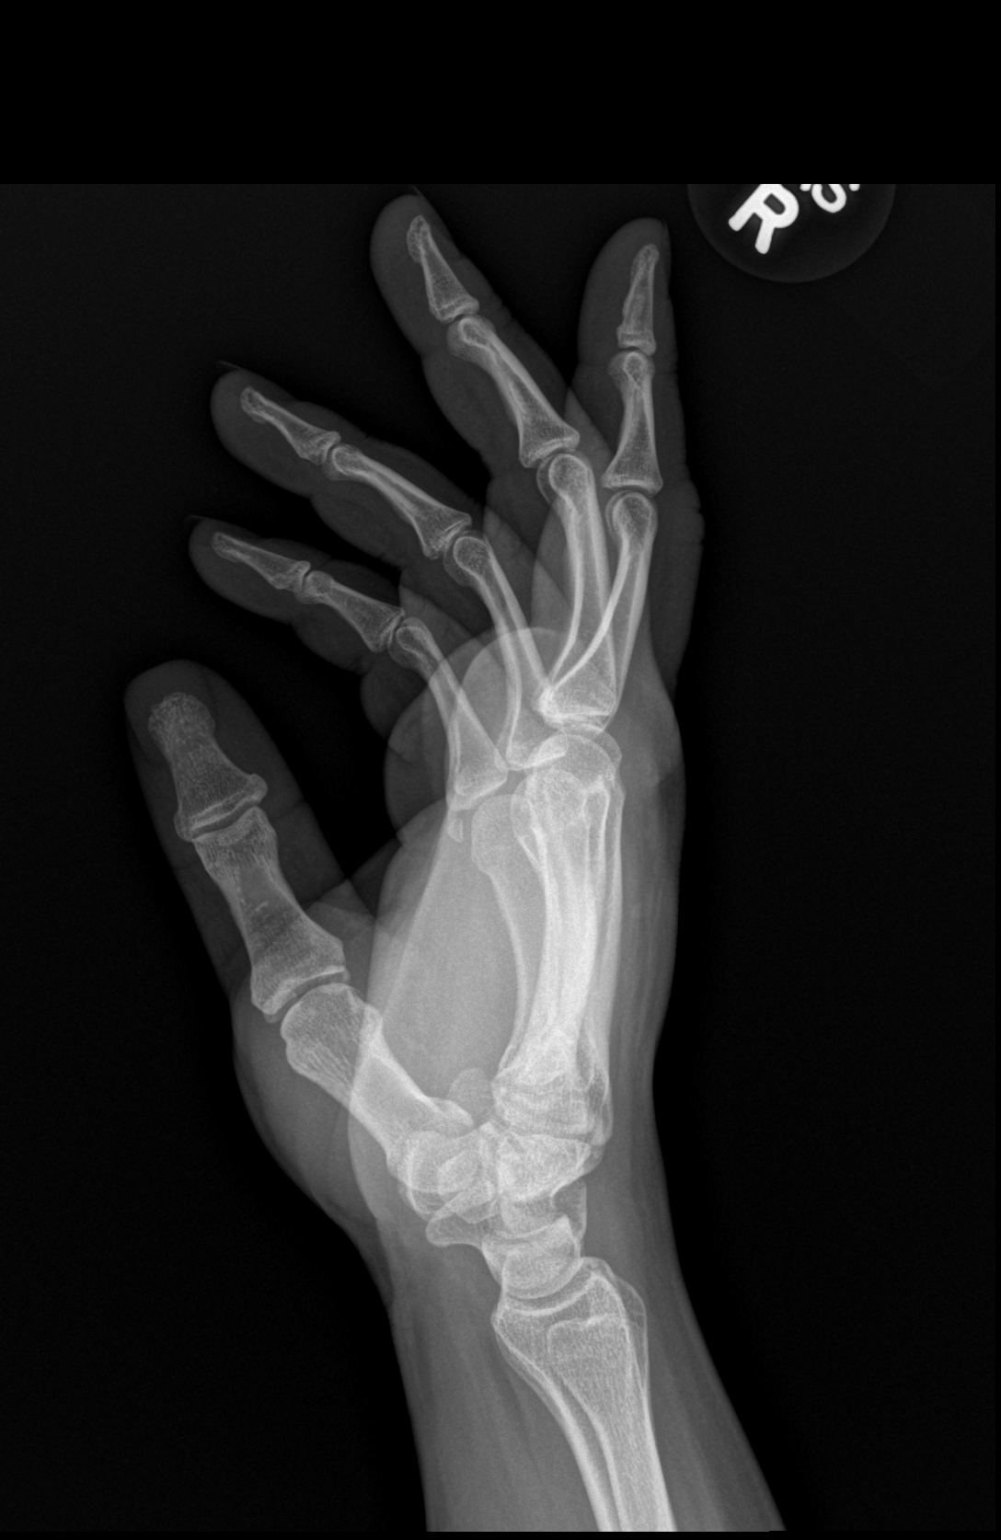

[3 of 3 positions shown; findings below may reference images not displayed]

FINDINGS: There is no evidence of fracture or dislocation. There is no
evidence of arthropathy or other focal bone abnormality. Soft
tissues are unremarkable.
IMPRESSION: Negative.

## 2019-07-29 ENCOUNTER — Ambulatory Visit: Attending: Internal Medicine

## 2019-07-29 DIAGNOSIS — Z23 Encounter for immunization: Secondary | ICD-10-CM

## 2019-07-29 NOTE — Progress Notes (Signed)
   Covid-19 Vaccination Clinic  Name:  Dawn Bennett    MRN: [id number] DOB: 12-28-1985  07/29/2019  Dawn Bennett was observed post Covid-19 immunization for 15 minutes without incident. She was provided with Vaccine Information Sheet and instruction to access the V-Safe system.   Dawn Bennett was instructed to call 911 with any severe reactions post vaccine: Marland Kitchen Difficulty breathing  . Swelling of face and throat  . A fast heartbeat  . A bad rash all over body  . Dizziness and weakness   Immunizations Administered    Name Date Dose VIS Date Route   Pfizer COVID-19 Vaccine 07/29/2019  8:21 AM 0.3 mL 03/29/2018 Intramuscular   Manufacturer: [employer], [employer]   Lot: [id number]   NDC: [address]-1
